# Patient Record
Sex: Male | Born: 1970 | Race: White | Hispanic: No | Marital: Married | Smoking: Never smoker
Health system: Southern US, Community
[De-identification: ages and names within clinical notes are randomized; demographics above are authoritative.]

## PROBLEM LIST (undated history)

## (undated) DIAGNOSIS — K429 Umbilical hernia without obstruction or gangrene: Secondary | ICD-10-CM

## (undated) DIAGNOSIS — Z8709 Personal history of other diseases of the respiratory system: Secondary | ICD-10-CM

## (undated) DIAGNOSIS — M1990 Unspecified osteoarthritis, unspecified site: Secondary | ICD-10-CM

## (undated) DIAGNOSIS — Z973 Presence of spectacles and contact lenses: Secondary | ICD-10-CM

## (undated) HISTORY — PX: KNEE ARTHROSCOPY: SUR90

---

## 2010-02-16 ENCOUNTER — Emergency Department (HOSPITAL_COMMUNITY)
Admission: EM | Admit: 2010-02-16 | Discharge: 2010-02-16 | Payer: Self-pay | Source: Home / Self Care | Admitting: Emergency Medicine

## 2015-10-30 DIAGNOSIS — F341 Dysthymic disorder: Secondary | ICD-10-CM | POA: Diagnosis not present

## 2016-01-02 DIAGNOSIS — R5381 Other malaise: Secondary | ICD-10-CM | POA: Diagnosis not present

## 2016-01-02 DIAGNOSIS — F418 Other specified anxiety disorders: Secondary | ICD-10-CM | POA: Diagnosis not present

## 2016-01-02 DIAGNOSIS — J45909 Unspecified asthma, uncomplicated: Secondary | ICD-10-CM | POA: Diagnosis not present

## 2016-01-02 DIAGNOSIS — R591 Generalized enlarged lymph nodes: Secondary | ICD-10-CM | POA: Diagnosis not present

## 2016-06-23 DIAGNOSIS — Z125 Encounter for screening for malignant neoplasm of prostate: Secondary | ICD-10-CM | POA: Diagnosis not present

## 2016-06-23 DIAGNOSIS — E559 Vitamin D deficiency, unspecified: Secondary | ICD-10-CM | POA: Diagnosis not present

## 2016-06-23 DIAGNOSIS — Z0000 Encounter for general adult medical examination without abnormal findings: Secondary | ICD-10-CM | POA: Diagnosis not present

## 2016-06-23 DIAGNOSIS — R5381 Other malaise: Secondary | ICD-10-CM | POA: Diagnosis not present

## 2016-06-30 DIAGNOSIS — Z125 Encounter for screening for malignant neoplasm of prostate: Secondary | ICD-10-CM | POA: Diagnosis not present

## 2016-06-30 DIAGNOSIS — M25551 Pain in right hip: Secondary | ICD-10-CM | POA: Diagnosis not present

## 2016-06-30 DIAGNOSIS — F4321 Adjustment disorder with depressed mood: Secondary | ICD-10-CM | POA: Diagnosis not present

## 2016-06-30 DIAGNOSIS — Z23 Encounter for immunization: Secondary | ICD-10-CM | POA: Diagnosis not present

## 2016-06-30 DIAGNOSIS — Z1389 Encounter for screening for other disorder: Secondary | ICD-10-CM | POA: Diagnosis not present

## 2016-06-30 DIAGNOSIS — Z0000 Encounter for general adult medical examination without abnormal findings: Secondary | ICD-10-CM | POA: Diagnosis not present

## 2016-06-30 DIAGNOSIS — E559 Vitamin D deficiency, unspecified: Secondary | ICD-10-CM | POA: Diagnosis not present

## 2016-06-30 DIAGNOSIS — F430 Acute stress reaction: Secondary | ICD-10-CM | POA: Diagnosis not present

## 2016-07-06 DIAGNOSIS — Z1212 Encounter for screening for malignant neoplasm of rectum: Secondary | ICD-10-CM | POA: Diagnosis not present

## 2016-07-17 ENCOUNTER — Ambulatory Visit: Payer: Self-pay | Admitting: General Surgery

## 2016-07-17 DIAGNOSIS — K429 Umbilical hernia without obstruction or gangrene: Secondary | ICD-10-CM | POA: Diagnosis not present

## 2016-07-28 ENCOUNTER — Ambulatory Visit (INDEPENDENT_AMBULATORY_CARE_PROVIDER_SITE_OTHER): Payer: BLUE CROSS/BLUE SHIELD | Admitting: Family Medicine

## 2016-07-28 ENCOUNTER — Ambulatory Visit (HOSPITAL_BASED_OUTPATIENT_CLINIC_OR_DEPARTMENT_OTHER)
Admission: RE | Admit: 2016-07-28 | Discharge: 2016-07-28 | Disposition: A | Discharge status: Home / Self Care | Payer: BLUE CROSS/BLUE SHIELD | Source: Ambulatory Visit | Attending: Family Medicine | Admitting: Family Medicine

## 2016-07-28 ENCOUNTER — Encounter: Payer: Self-pay | Admitting: Family Medicine

## 2016-07-28 VITALS — BP 125/69 | HR 76 | Ht 71.0 in | Wt 206.0 lb

## 2016-07-28 DIAGNOSIS — M25551 Pain in right hip: Secondary | ICD-10-CM | POA: Insufficient documentation

## 2016-07-28 DIAGNOSIS — M1611 Unilateral primary osteoarthritis, right hip: Secondary | ICD-10-CM | POA: Diagnosis not present

## 2016-07-28 DIAGNOSIS — M25751 Osteophyte, right hip: Secondary | ICD-10-CM | POA: Diagnosis not present

## 2016-07-30 DIAGNOSIS — M25551 Pain in right hip: Secondary | ICD-10-CM | POA: Insufficient documentation

## 2016-07-30 NOTE — Progress Notes (Signed)
PCP: Creola Cornusso, John, MD  Subjective:   HPI: Patient is a 46 y.o. male here for right hip pain.  Patient reports he's had problems with right hip for years. However past 6 months the pain is worse. Pain is 1/10 at rest, up to 6/10 and sharp at worst. Used to play tennis but hasn't in a couple years. Pain stays anterior hip. No radiation. No numbness or tingling. Feels motion is limited. Worse with flexion of hip. No back pain.  No past medical history on file.  No current outpatient prescriptions on file prior to visit.   No current facility-administered medications on file prior to visit.     No past surgical history on file.  No Known Allergies  Social History   Social History  . Marital status: Married    Spouse name: N/A  . Number of children: N/A  . Years of education: N/A   Occupational History  . Not on file.   Social History Main Topics  . Smoking status: Never Smoker  . Smokeless tobacco: Never Used  . Alcohol use Not on file  . Drug use: Unknown  . Sexual activity: Not on file   Other Topics Concern  . Not on file   Social History Narrative  . No narrative on file    No family history on file.  BP 125/69   Pulse 76   Ht 5\' 11"  (1.803 m)   Wt 206 lb (93.4 kg)   BMI 28.73 kg/m   Review of Systems: See HPI above.     Objective:  Physical Exam:  Gen: NAD, comfortable in exam room  Back/right hip: No gross deformity, scoliosis. TTP over anterior hip joint.  No midline or bony TTP.  No back tenderness. FROM back but only has 5 degrees IR and ER of hip. Strength LEs 5/5 all muscle groups - some pain with hip flexion. 2+ MSRs in patellar and achilles tendons, equal bilaterally. Negative SLRs. Sensation intact to light touch bilaterally. Positive logroll right hip, negative left   Assessment & Plan:  1. Right hip pain - independently reviewed radiographs showing moderate degenerative changes of right hip - also has morphology of cam  impingement.  We discussed tylenol, topical medications, some supplements that may help.  Encouraged physical therapy which he will start.  Will also set him up to have an intraarticular cortisone injection of this hip due to level of his pain.  Heat or ice if needed.  Plan to follow up in 1 month to 6 weeks for reevaluation.

## 2016-07-30 NOTE — Assessment & Plan Note (Signed)
independently reviewed radiographs showing moderate degenerative changes of right hip - also has morphology of cam impingement.  We discussed tylenol, topical medications, some supplements that may help.  Encouraged physical therapy which he will start.  Will also set him up to have an intraarticular cortisone injection of this hip due to level of his pain.  Heat or ice if needed.  Plan to follow up in 1 month to 6 weeks for reevaluation.

## 2016-07-31 ENCOUNTER — Other Ambulatory Visit: Payer: Self-pay | Admitting: Family Medicine

## 2016-07-31 DIAGNOSIS — M25551 Pain in right hip: Secondary | ICD-10-CM

## 2016-08-06 ENCOUNTER — Ambulatory Visit
Admission: RE | Admit: 2016-08-06 | Discharge: 2016-08-06 | Disposition: A | Discharge status: Home / Self Care | Payer: BLUE CROSS/BLUE SHIELD | Source: Ambulatory Visit | Attending: Family Medicine | Admitting: Family Medicine

## 2016-08-06 DIAGNOSIS — M25551 Pain in right hip: Secondary | ICD-10-CM

## 2016-08-06 DIAGNOSIS — M1611 Unilateral primary osteoarthritis, right hip: Secondary | ICD-10-CM | POA: Diagnosis not present

## 2016-08-06 MED ORDER — IOPAMIDOL (ISOVUE-M 200) INJECTION 41%
1.0000 mL | Freq: Once | INTRAMUSCULAR | Status: AC
  Administered 2016-08-06: 1 mL via INTRA_ARTICULAR

## 2016-08-06 MED ORDER — METHYLPREDNISOLONE ACETATE 40 MG/ML INJ SUSP (RADIOLOG
120.0000 mg | Freq: Once | INTRAMUSCULAR | Status: AC
  Administered 2016-08-06: 120 mg via INTRA_ARTICULAR

## 2016-10-15 DIAGNOSIS — M25551 Pain in right hip: Secondary | ICD-10-CM | POA: Diagnosis not present

## 2016-10-21 DIAGNOSIS — M25551 Pain in right hip: Secondary | ICD-10-CM | POA: Diagnosis not present

## 2016-10-29 DIAGNOSIS — M25551 Pain in right hip: Secondary | ICD-10-CM | POA: Diagnosis not present

## 2016-11-05 DIAGNOSIS — M25551 Pain in right hip: Secondary | ICD-10-CM | POA: Diagnosis not present

## 2016-11-18 DIAGNOSIS — M25551 Pain in right hip: Secondary | ICD-10-CM | POA: Diagnosis not present

## 2016-12-02 DIAGNOSIS — M25551 Pain in right hip: Secondary | ICD-10-CM | POA: Diagnosis not present

## 2016-12-10 ENCOUNTER — Encounter: Payer: Self-pay | Admitting: Family Medicine

## 2016-12-17 DIAGNOSIS — M25551 Pain in right hip: Secondary | ICD-10-CM | POA: Diagnosis not present

## 2016-12-31 DIAGNOSIS — M25551 Pain in right hip: Secondary | ICD-10-CM | POA: Diagnosis not present

## 2017-01-14 DIAGNOSIS — M25551 Pain in right hip: Secondary | ICD-10-CM | POA: Diagnosis not present

## 2017-02-02 DIAGNOSIS — M25551 Pain in right hip: Secondary | ICD-10-CM | POA: Diagnosis not present

## 2017-02-19 DIAGNOSIS — M25551 Pain in right hip: Secondary | ICD-10-CM | POA: Diagnosis not present

## 2017-04-22 DIAGNOSIS — M25551 Pain in right hip: Secondary | ICD-10-CM | POA: Diagnosis not present

## 2017-05-13 DIAGNOSIS — M25551 Pain in right hip: Secondary | ICD-10-CM | POA: Diagnosis not present

## 2017-06-01 DIAGNOSIS — M25551 Pain in right hip: Secondary | ICD-10-CM | POA: Diagnosis not present

## 2017-07-06 DIAGNOSIS — E559 Vitamin D deficiency, unspecified: Secondary | ICD-10-CM | POA: Diagnosis not present

## 2017-07-06 DIAGNOSIS — Z125 Encounter for screening for malignant neoplasm of prostate: Secondary | ICD-10-CM | POA: Diagnosis not present

## 2017-07-06 DIAGNOSIS — Z0000 Encounter for general adult medical examination without abnormal findings: Secondary | ICD-10-CM | POA: Diagnosis not present

## 2017-07-06 DIAGNOSIS — R5381 Other malaise: Secondary | ICD-10-CM | POA: Diagnosis not present

## 2017-07-13 DIAGNOSIS — K429 Umbilical hernia without obstruction or gangrene: Secondary | ICD-10-CM | POA: Diagnosis not present

## 2017-07-13 DIAGNOSIS — F4321 Adjustment disorder with depressed mood: Secondary | ICD-10-CM | POA: Diagnosis not present

## 2017-07-13 DIAGNOSIS — Z0000 Encounter for general adult medical examination without abnormal findings: Secondary | ICD-10-CM | POA: Diagnosis not present

## 2017-07-13 DIAGNOSIS — E786 Lipoprotein deficiency: Secondary | ICD-10-CM | POA: Diagnosis not present

## 2017-07-13 DIAGNOSIS — Z1389 Encounter for screening for other disorder: Secondary | ICD-10-CM | POA: Diagnosis not present

## 2017-07-13 DIAGNOSIS — M25551 Pain in right hip: Secondary | ICD-10-CM | POA: Diagnosis not present

## 2017-07-14 ENCOUNTER — Other Ambulatory Visit: Payer: Self-pay | Admitting: Internal Medicine

## 2017-07-14 DIAGNOSIS — E786 Lipoprotein deficiency: Secondary | ICD-10-CM

## 2017-08-10 ENCOUNTER — Other Ambulatory Visit: Payer: BLUE CROSS/BLUE SHIELD

## 2017-08-10 ENCOUNTER — Ambulatory Visit
Admission: RE | Admit: 2017-08-10 | Discharge: 2017-08-10 | Disposition: A | Discharge status: Home / Self Care | Payer: No Typology Code available for payment source | Source: Ambulatory Visit | Attending: Internal Medicine | Admitting: Internal Medicine

## 2017-08-10 DIAGNOSIS — E786 Lipoprotein deficiency: Secondary | ICD-10-CM

## 2017-08-19 DIAGNOSIS — M1611 Unilateral primary osteoarthritis, right hip: Secondary | ICD-10-CM | POA: Diagnosis not present

## 2017-09-14 DIAGNOSIS — K429 Umbilical hernia without obstruction or gangrene: Secondary | ICD-10-CM | POA: Diagnosis not present

## 2017-09-24 ENCOUNTER — Other Ambulatory Visit: Payer: Self-pay

## 2017-09-24 ENCOUNTER — Encounter (HOSPITAL_BASED_OUTPATIENT_CLINIC_OR_DEPARTMENT_OTHER): Payer: Self-pay | Admitting: *Deleted

## 2017-09-24 DIAGNOSIS — M25551 Pain in right hip: Secondary | ICD-10-CM | POA: Diagnosis not present

## 2017-09-24 NOTE — Progress Notes (Signed)
Spoke w/ pt via phone for pre-op interview.  Pt verbalized understanding to be npo after mn w/ exception clear liquids until 0845 then nothing by mouth and arrive at 0945 to [facility].  Pre-op orders pending.  Or scheduler, brenda garrett has been called twice before today.  Called via phone and spoke w/ brenda garrett, or scheduler, today stated she will track him down to put orders in epic.

## 2017-09-27 ENCOUNTER — Ambulatory Visit: Payer: Self-pay | Admitting: General Surgery

## 2017-09-27 NOTE — Progress Notes (Signed)
Steward DroneBrenda states Dr. Sheliah HatchKinsinger will be in office this afternoon and will place orders at that time.

## 2017-09-28 ENCOUNTER — Ambulatory Visit (HOSPITAL_COMMUNITY): Payer: BLUE CROSS/BLUE SHIELD

## 2017-09-28 ENCOUNTER — Encounter (HOSPITAL_BASED_OUTPATIENT_CLINIC_OR_DEPARTMENT_OTHER): Payer: Self-pay | Admitting: *Deleted

## 2017-09-28 ENCOUNTER — Ambulatory Visit (HOSPITAL_BASED_OUTPATIENT_CLINIC_OR_DEPARTMENT_OTHER): Payer: BLUE CROSS/BLUE SHIELD | Admitting: Anesthesiology

## 2017-09-28 ENCOUNTER — Other Ambulatory Visit: Payer: Self-pay

## 2017-09-28 ENCOUNTER — Encounter (HOSPITAL_BASED_OUTPATIENT_CLINIC_OR_DEPARTMENT_OTHER)
Admission: RE | Disposition: A | Discharge status: Home / Self Care | Payer: Self-pay | Source: Ambulatory Visit | Attending: General Surgery

## 2017-09-28 ENCOUNTER — Ambulatory Visit (HOSPITAL_BASED_OUTPATIENT_CLINIC_OR_DEPARTMENT_OTHER)
Admission: RE | Admit: 2017-09-28 | Discharge: 2017-09-28 | Disposition: A | Discharge status: Home / Self Care | Payer: BLUE CROSS/BLUE SHIELD | Source: Ambulatory Visit | Attending: General Surgery | Admitting: General Surgery

## 2017-09-28 DIAGNOSIS — T17908A Unspecified foreign body in respiratory tract, part unspecified causing other injury, initial encounter: Secondary | ICD-10-CM

## 2017-09-28 DIAGNOSIS — Z79899 Other long term (current) drug therapy: Secondary | ICD-10-CM | POA: Insufficient documentation

## 2017-09-28 DIAGNOSIS — K420 Umbilical hernia with obstruction, without gangrene: Secondary | ICD-10-CM | POA: Insufficient documentation

## 2017-09-28 DIAGNOSIS — K429 Umbilical hernia without obstruction or gangrene: Secondary | ICD-10-CM | POA: Diagnosis not present

## 2017-09-28 DIAGNOSIS — R1110 Vomiting, unspecified: Secondary | ICD-10-CM | POA: Diagnosis not present

## 2017-09-28 DIAGNOSIS — R109 Unspecified abdominal pain: Secondary | ICD-10-CM | POA: Diagnosis not present

## 2017-09-28 HISTORY — DX: Umbilical hernia without obstruction or gangrene: K42.9

## 2017-09-28 HISTORY — DX: Presence of spectacles and contact lenses: Z97.3

## 2017-09-28 HISTORY — DX: Unspecified osteoarthritis, unspecified site: M19.90

## 2017-09-28 HISTORY — DX: Personal history of other diseases of the respiratory system: Z87.09

## 2017-09-28 HISTORY — PX: UMBILICAL HERNIA REPAIR: SHX196

## 2017-09-28 SURGERY — REPAIR, HERNIA, UMBILICAL, ADULT
Anesthesia: General

## 2017-09-28 MED ORDER — SUGAMMADEX SODIUM 500 MG/5ML IV SOLN
INTRAVENOUS | Status: DC | PRN
  Administered 2017-09-28: 200 mg via INTRAVENOUS

## 2017-09-28 MED ORDER — CEFAZOLIN SODIUM-DEXTROSE 2-4 GM/100ML-% IV SOLN
INTRAVENOUS | Status: AC
  Filled 2017-09-28: qty 100

## 2017-09-28 MED ORDER — DEXAMETHASONE SODIUM PHOSPHATE 10 MG/ML IJ SOLN
INTRAMUSCULAR | Status: AC
  Filled 2017-09-28: qty 1

## 2017-09-28 MED ORDER — MEPERIDINE HCL 25 MG/ML IJ SOLN
6.2500 mg | INTRAMUSCULAR | Status: DC | PRN
  Filled 2017-09-28: qty 1

## 2017-09-28 MED ORDER — MIDAZOLAM HCL 5 MG/5ML IJ SOLN
INTRAMUSCULAR | Status: DC | PRN
  Administered 2017-09-28: 2 mg via INTRAVENOUS

## 2017-09-28 MED ORDER — KETOROLAC TROMETHAMINE 30 MG/ML IJ SOLN
INTRAMUSCULAR | Status: DC | PRN
  Administered 2017-09-28: 30 mg via INTRAVENOUS

## 2017-09-28 MED ORDER — ACETAMINOPHEN 500 MG PO TABS
ORAL_TABLET | ORAL | Status: AC
  Filled 2017-09-28: qty 2

## 2017-09-28 MED ORDER — KETOROLAC TROMETHAMINE 30 MG/ML IJ SOLN
INTRAMUSCULAR | Status: AC
  Filled 2017-09-28: qty 1

## 2017-09-28 MED ORDER — ONDANSETRON HCL 4 MG/2ML IJ SOLN
4.0000 mg | Freq: Once | INTRAMUSCULAR | Status: DC | PRN
  Filled 2017-09-28: qty 2

## 2017-09-28 MED ORDER — SUGAMMADEX SODIUM 200 MG/2ML IV SOLN
INTRAVENOUS | Status: AC
  Filled 2017-09-28: qty 2

## 2017-09-28 MED ORDER — SUCCINYLCHOLINE CHLORIDE 20 MG/ML IJ SOLN
INTRAMUSCULAR | Status: DC | PRN
  Administered 2017-09-28: 120 mg via INTRAVENOUS

## 2017-09-28 MED ORDER — GABAPENTIN 300 MG PO CAPS
300.0000 mg | ORAL_CAPSULE | ORAL | Status: AC
  Administered 2017-09-28: 300 mg via ORAL
  Filled 2017-09-28: qty 1

## 2017-09-28 MED ORDER — PROPOFOL 10 MG/ML IV BOLUS
INTRAVENOUS | Status: DC | PRN
  Administered 2017-09-28: 150 mg via INTRAVENOUS

## 2017-09-28 MED ORDER — HYDROCODONE-ACETAMINOPHEN 5-325 MG PO TABS: 1.0000 | ORAL_TABLET | Freq: Four times a day (QID) | ORAL | 0 refills | Status: AC | PRN

## 2017-09-28 MED ORDER — DEXAMETHASONE SODIUM PHOSPHATE 4 MG/ML IJ SOLN
INTRAMUSCULAR | Status: DC | PRN
  Administered 2017-09-28: 10 mg via INTRAVENOUS

## 2017-09-28 MED ORDER — FENTANYL CITRATE (PF) 100 MCG/2ML IJ SOLN
INTRAMUSCULAR | Status: DC | PRN
  Administered 2017-09-28: 100 ug via INTRAVENOUS

## 2017-09-28 MED ORDER — ROCURONIUM BROMIDE 100 MG/10ML IV SOLN
INTRAVENOUS | Status: DC | PRN
  Administered 2017-09-28: 50 mg via INTRAVENOUS

## 2017-09-28 MED ORDER — BUPIVACAINE LIPOSOME 1.3 % IJ SUSP
INTRAMUSCULAR | Status: DC | PRN
  Administered 2017-09-28: 50 mL

## 2017-09-28 MED ORDER — MIDAZOLAM HCL 2 MG/2ML IJ SOLN
INTRAMUSCULAR | Status: AC
  Filled 2017-09-28: qty 2

## 2017-09-28 MED ORDER — CEFAZOLIN SODIUM-DEXTROSE 2-4 GM/100ML-% IV SOLN
2.0000 g | INTRAVENOUS | Status: AC
  Administered 2017-09-28: 2 g via INTRAVENOUS
  Filled 2017-09-28: qty 100

## 2017-09-28 MED ORDER — IBUPROFEN 800 MG PO TABS: 800.0000 mg | ORAL_TABLET | Freq: Three times a day (TID) | ORAL | 0 refills | Status: AC | PRN

## 2017-09-28 MED ORDER — ONDANSETRON HCL 4 MG/2ML IJ SOLN
INTRAMUSCULAR | Status: AC
  Filled 2017-09-28: qty 2

## 2017-09-28 MED ORDER — LIDOCAINE HCL (CARDIAC) PF 100 MG/5ML IV SOSY
PREFILLED_SYRINGE | INTRAVENOUS | Status: DC | PRN
  Administered 2017-09-28: 100 mg via INTRAVENOUS

## 2017-09-28 MED ORDER — ACETAMINOPHEN 500 MG PO TABS
1000.0000 mg | ORAL_TABLET | ORAL | Status: AC
  Administered 2017-09-28: 1000 mg via ORAL
  Filled 2017-09-28: qty 2

## 2017-09-28 MED ORDER — LACTATED RINGERS IV SOLN
INTRAVENOUS | Status: DC
  Administered 2017-09-28 (×3): via INTRAVENOUS
  Filled 2017-09-28: qty 1000

## 2017-09-28 MED ORDER — ROCURONIUM BROMIDE 100 MG/10ML IV SOLN
INTRAVENOUS | Status: AC
  Filled 2017-09-28: qty 1

## 2017-09-28 MED ORDER — PROPOFOL 10 MG/ML IV BOLUS
INTRAVENOUS | Status: AC
  Filled 2017-09-28: qty 20

## 2017-09-28 MED ORDER — CHLORHEXIDINE GLUCONATE 4 % EX LIQD
60.0000 mL | Freq: Once | CUTANEOUS | Status: DC
  Filled 2017-09-28: qty 118

## 2017-09-28 MED ORDER — ENSURE PRE-SURGERY PO LIQD
296.0000 mL | Freq: Once | ORAL | Status: DC
  Filled 2017-09-28: qty 296

## 2017-09-28 MED ORDER — FENTANYL CITRATE (PF) 100 MCG/2ML IJ SOLN
INTRAMUSCULAR | Status: AC
  Filled 2017-09-28: qty 2

## 2017-09-28 MED ORDER — SUCCINYLCHOLINE CHLORIDE 200 MG/10ML IV SOSY
PREFILLED_SYRINGE | INTRAVENOUS | Status: AC
  Filled 2017-09-28: qty 10

## 2017-09-28 MED ORDER — FUROSEMIDE 10 MG/ML IJ SOLN
INTRAMUSCULAR | Status: DC | PRN
  Administered 2017-09-28: 20 mg via INTRAMUSCULAR

## 2017-09-28 MED ORDER — HYDROMORPHONE HCL 1 MG/ML IJ SOLN
0.2500 mg | INTRAMUSCULAR | Status: DC | PRN
Start: 2017-09-28 — End: 2017-09-28
  Filled 2017-09-28: qty 0.5

## 2017-09-28 MED ORDER — GABAPENTIN 300 MG PO CAPS
ORAL_CAPSULE | ORAL | Status: AC
  Filled 2017-09-28: qty 1

## 2017-09-28 MED ORDER — FUROSEMIDE 10 MG/ML IJ SOLN
INTRAMUSCULAR | Status: AC
  Filled 2017-09-28: qty 2

## 2017-09-28 MED ORDER — ONDANSETRON HCL 4 MG/2ML IJ SOLN
INTRAMUSCULAR | Status: DC | PRN
  Administered 2017-09-28: 4 mg via INTRAVENOUS

## 2017-09-28 SURGICAL SUPPLY — 43 items
BLADE HEX COATED 2.75 (ELECTRODE) ×2 IMPLANT
BLADE SURG 15 STRL LF DISP TIS (BLADE) ×1 IMPLANT
BLADE SURG 15 STRL SS (BLADE) ×1
BLADE SURG ROTATE 9660 (MISCELLANEOUS) IMPLANT
CANISTER SUCT 3000ML PPV (MISCELLANEOUS) IMPLANT
CELLS DAT CNTRL 66122 CELL SVR (MISCELLANEOUS) IMPLANT
CHLORAPREP W/TINT 26ML (MISCELLANEOUS) ×2 IMPLANT
COVER BACK TABLE 60X90IN (DRAPES) ×2 IMPLANT
COVER MAYO STAND STRL (DRAPES) ×2 IMPLANT
DECANTER SPIKE VIAL GLASS SM (MISCELLANEOUS) IMPLANT
DERMABOND ADVANCED (GAUZE/BANDAGES/DRESSINGS) ×1
DERMABOND ADVANCED .7 DNX12 (GAUZE/BANDAGES/DRESSINGS) ×1 IMPLANT
DRAPE LAPAROSCOPIC ABDOMINAL (DRAPES) ×2 IMPLANT
DRAPE UTILITY XL STRL (DRAPES) ×2 IMPLANT
ELECT REM PT RETURN 9FT ADLT (ELECTROSURGICAL) ×2
ELECTRODE REM PT RTRN 9FT ADLT (ELECTROSURGICAL) ×1 IMPLANT
GLOVE BIOGEL PI IND STRL 7.0 (GLOVE) ×1 IMPLANT
GLOVE BIOGEL PI INDICATOR 7.0 (GLOVE) ×1
GLOVE SURG SS PI 7.0 STRL IVOR (GLOVE) ×2 IMPLANT
GOWN STRL REUS W/TWL LRG LVL3 (GOWN DISPOSABLE) ×4 IMPLANT
KIT TURNOVER CYSTO (KITS) ×2 IMPLANT
NEEDLE HYPO 25X1 1.5 SAFETY (NEEDLE) ×2 IMPLANT
NS IRRIG 500ML POUR BTL (IV SOLUTION) ×2 IMPLANT
PACK BASIN DAY SURGERY FS (CUSTOM PROCEDURE TRAY) ×2 IMPLANT
PENCIL BUTTON HOLSTER BLD 10FT (ELECTRODE) ×2 IMPLANT
RTRCTR WOUND ALEXIS 18CM MED (MISCELLANEOUS)
RTRCTR WOUND ALEXIS 18CM SML (INSTRUMENTS)
SAVER CELL AAL HAEMONETICS (INSTRUMENTS) IMPLANT
SPONGE LAP 4X18 RFD (DISPOSABLE) ×2 IMPLANT
SUT MNCRL AB 4-0 PS2 18 (SUTURE) ×2 IMPLANT
SUT NOVA NAB GS-21 0 18 T12 DT (SUTURE) ×2 IMPLANT
SUT PDS AB 0 CT1 36 (SUTURE) ×2 IMPLANT
SUT PROLENE 0 CT 1 CR/8 (SUTURE) IMPLANT
SUT VIC AB 0 SH 27 (SUTURE) IMPLANT
SUT VIC AB 2-0 SH 27 (SUTURE)
SUT VIC AB 2-0 SH 27XBRD (SUTURE) IMPLANT
SUT VIC AB 3-0 SH 27 (SUTURE) ×1
SUT VIC AB 3-0 SH 27X BRD (SUTURE) ×1 IMPLANT
SYR BULB IRRIGATION 50ML (SYRINGE) ×2 IMPLANT
SYR CONTROL 10ML LL (SYRINGE) ×2 IMPLANT
TOWEL OR 17X24 6PK STRL BLUE (TOWEL DISPOSABLE) ×4 IMPLANT
TUBE CONNECTING 12X1/4 (SUCTIONS) ×2 IMPLANT
YANKAUER SUCT BULB TIP NO VENT (SUCTIONS) ×2 IMPLANT

## 2017-09-28 NOTE — Progress Notes (Signed)
Dr Krista BlueSinger MDA here to evaluate-O2 maintaining 95% or above. Verbalized to discharge home. Bilateral  breath sounds clear.

## 2017-09-28 NOTE — Anesthesia Procedure Notes (Addendum)
Procedure Name: Intubation Date/Time: 09/28/2017 11:09 AM Performed by: Wanita Chamberlain, CRNA Pre-anesthesia Checklist: Patient identified, Emergency Drugs available, Suction available, Patient being monitored and Timeout performed Patient Re-evaluated:Patient Re-evaluated prior to induction Oxygen Delivery Method: Circle system utilized Preoxygenation: Pre-oxygenation with 100% oxygen Induction Type: IV induction and Cricoid Pressure applied Ventilation: Mask ventilation without difficulty Laryngoscope Size: Mac and 4 Grade View: Grade I Tube type: Oral Tube size: 7.5 mm Number of attempts: 1 Placement Confirmation: breath sounds checked- equal and bilateral,  CO2 detector,  positive ETCO2 and ETT inserted through vocal cords under direct vision Secured at: 23 cm Tube secured with: Tape Dental Injury: Teeth and Oropharynx as per pre-operative assessment

## 2017-09-28 NOTE — Transfer of Care (Signed)
Immediate Anesthesia Transfer of Care Note  Patient: Donald ArenaDaniel W Lucus  Procedure(s) Performed: Procedure(s) (LRB): open HERNIA REPAIR UMBILICAL ADULT (N/A)  Patient Location: PACU  Anesthesia Type: General  Level of Consciousness: awake, sedated, patient cooperative and responds to stimulation  Airway & Oxygen Therapy: Patient Spontanous Breathing and Patient connected to face mask oxygen  Post-op Assessment: Report given to PACU RN, Post -op Vital signs reviewed and stable and Patient moving all extremities  Post vital signs: Reviewed and stable  Complications: No apparent anesthesia complications

## 2017-09-28 NOTE — H&P (Signed)
Donald Morgan is an 47 y.o. male.   Chief Complaint: abdominal pain HPI: 47 yo male with abdominal pain especially with lifting found to have a umbilical hernia.  Past Medical History:  Diagnosis Date  . Arthritis    right hip  . History of asthma    childhood  . Umbilical hernia   . Wears glasses     Past Surgical History:  Procedure Laterality Date  . KNEE ARTHROSCOPY Left age 47    History reviewed. No pertinent family history. Social History:  reports that he has never smoked. He has never used smokeless tobacco. He reports that he drinks about 10.8 oz of alcohol per week. His drug history is not on file.  Allergies: No Known Allergies  Medications Prior to Admission  Medication Sig Dispense Refill  . acetaminophen (TYLENOL) 500 MG tablet Take 500 mg by mouth every 6 (six) hours as needed.    Marland Kitchen. escitalopram (LEXAPRO) 20 MG tablet TK 1 T PO D-- takes in pm  6  . ibuprofen (ADVIL,MOTRIN) 200 MG tablet Take 200 mg by mouth every 6 (six) hours as needed.    . IRON PO Take by mouth daily.     . Multiple Vitamin (MULTIVITAMIN) tablet Take 1 tablet by mouth daily.    . naproxen sodium (ALEVE) 220 MG tablet Take 220 mg by mouth 2 (two) times daily as needed.      No results found for this or any previous visit (from the past 48 hour(s)). No results found.  Review of Systems  Constitutional: Negative for chills and fever.  HENT: Negative for hearing loss.   Eyes: Negative for blurred vision and double vision.  Respiratory: Negative for cough and hemoptysis.   Cardiovascular: Negative for chest pain and palpitations.  Gastrointestinal: Positive for abdominal pain. Negative for nausea and vomiting.  Genitourinary: Negative for dysuria and urgency.  Musculoskeletal: Negative for myalgias and neck pain.  Skin: Negative for itching and rash.  Neurological: Negative for dizziness, tingling and headaches.  Endo/Heme/Allergies: Does not bruise/bleed easily.   Psychiatric/Behavioral: Negative for depression and suicidal ideas.    Blood pressure 129/89, pulse (!) 58, temperature 99 F (37.2 C), temperature source Oral, resp. rate 18, height 5\' 11"  (1.803 m), weight 94.3 kg (207 lb 12.8 oz), SpO2 99 %. Physical Exam  Vitals reviewed. Constitutional: He is oriented to person, place, and time. He appears well-developed and well-nourished.  HENT:  Head: Normocephalic and atraumatic.  Eyes: Pupils are equal, round, and reactive to light. Conjunctivae and EOM are normal.  Neck: Normal range of motion. Neck supple.  Cardiovascular: Normal rate and regular rhythm.  Respiratory: Effort normal and breath sounds normal.  GI: Soft. Bowel sounds are normal. He exhibits no distension. There is no tenderness.  Umbilical hernia  Musculoskeletal: Normal range of motion.  Neurological: He is alert and oriented to person, place, and time.  Skin: Skin is warm and dry.  Psychiatric: He has a normal mood and affect. His behavior is normal.     Assessment/Plan 47 yo male with umbilical hernia -open umbilical hernia repair with mesh -outpatient procedure -enhanced recovery  Rodman PickleLuke Aaron Yoandri Congrove, MD 09/28/2017, 10:46 AM

## 2017-09-28 NOTE — Discharge Instructions (Signed)
Information for Discharge Teaching: EXPAREL (bupivacaine liposome injectable suspension)   Your surgeon gave you EXPAREL(bupivacaine) in your surgical incision to help control your pain after surgery.   EXPAREL is a local anesthetic that provides pain relief by numbing the tissue around the surgical site.  EXPAREL is designed to release pain medication over time and can control pain for up to 72 hours.  Depending on how you respond to EXPAREL, you may require less pain medication during your recovery.  Possible side effects:  Temporary loss of sensation or ability to move in the area where bupivacaine was injected.  Nausea, vomiting, constipation  Rarely, numbness and tingling in your mouth or lips, lightheadedness, or anxiety may occur.  Call your doctor right away if you think you may be experiencing any of these sensations, or if you have other questions regarding possible side effects.  Follow all other discharge instructions given to you by your surgeon or nurse. Eat a healthy diet and drink plenty of water or other fluids.  If you return to the hospital for any reason within 96 hours following the administration of EXPAREL, please inform your health care providers. Post Anesthesia Home Care Instructions  Activity: Get plenty of rest for the remainder of the day. A responsible individual must stay with you for 24 hours following the procedure.  For the next 24 hours, DO NOT: -Drive a car -Operate machinery -Drink alcoholic beverages -Take any medication unless instructed by your physician -Make any legal decisions or sign important papers.  Meals: Start with liquid foods such as gelatin or soup. Progress to regular foods as tolerated. Avoid greasy, spicy, heavy foods. If nausea and/or vomiting occur, drink only clear liquids until the nausea and/or vomiting subsides. Call your physician if vomiting continues.  Special Instructions/Symptoms: Your throat may feel dry or sore  from the anesthesia or the breathing tube placed in your throat during surgery. If this causes discomfort, gargle with warm salt water. The discomfort should disappear within 24 hours.  If you had a scopolamine patch placed behind your ear for the management of post- operative nausea and/or vomiting:  1. The medication in the patch is effective for 72 hours, after which it should be removed.  Wrap patch in a tissue and discard in the trash. Wash hands thoroughly with soap and water. 2. You may remove the patch earlier than 72 hours if you experience unpleasant side effects which may include dry mouth, dizziness or visual disturbances. 3. Avoid touching the patch. Wash your hands with soap and water after contact with the patch.     

## 2017-09-28 NOTE — Progress Notes (Signed)
Pt developed negative pressure pulmonary edema while intubated due to biting down and occluding the ETT during emergence. Anectine was given. Frothy bloody fluid was suctioned from the ETT. Lasix 20mg  was given with improvement in saturations. Pt was suctioned multiple times and extubated and taken to the recovery room in stable condition. CXR was negative. He initially had congestion in his lungs which resolved over the first half hour in PACU.  We decided to observe him at least till the end of the day and Dr Krista BlueSinger will evaluate him for discharge at that time. Discussed with his wife and patient and answered all questions. Discussed with Dr Sheliah HatchKinsinger.  Arta BruceKevin Derhonda Eastlick MD

## 2017-09-28 NOTE — Anesthesia Postprocedure Evaluation (Signed)
Anesthesia Post Note  Patient: Lenise ArenaDaniel W Madero  Procedure(s) Performed: open HERNIA REPAIR UMBILICAL ADULT (N/A )     Patient location during evaluation: PACU Anesthesia Type: General Level of consciousness: awake and alert Pain management: pain level controlled Vital Signs Assessment: post-procedure vital signs reviewed and stable Respiratory status: spontaneous breathing, nonlabored ventilation, respiratory function stable and patient connected to nasal cannula oxygen Cardiovascular status: blood pressure returned to baseline and stable Postop Assessment: no apparent nausea or vomiting Anesthetic complications: yes Anesthetic complication details: anesthesia complicationsComments: Pt developed negative pressure pulmonary edema while intubated due to biting down and occluding the ETT during emergence. Anectine was given. Frothy bloody fluid was suctioned from the ETT. Lasix 20mg  was given with improvement in saturations. Pt was suctioned multiple times and extubated and taken to the recovery room in stable condition. CXR was negative. He initially had congestion in his lungs which resolved over the first half hour in PACU.  We decided to observe him at least till the end of the day and Dr Krista BlueSinger will evaluate him for discharge at that time. Discussed with his wife and patient and answered all questions. Arta BruceKevin Anshu Wehner MD    Last Vitals:  Vitals:   09/28/17 1330 09/28/17 1345  BP: 116/71 111/75  Pulse: 66 68  Resp: (!) 24 18  Temp:    SpO2: 100% 100%    Last Pain:  Vitals:   09/28/17 1330  TempSrc:   PainSc: 0-No pain                 Martavion Couper DAVID

## 2017-09-28 NOTE — Op Note (Signed)
PATIENT:  Lenise Arenaaniel W Mccaskill  47 y.o. male  PRE-OPERATIVE DIAGNOSIS:  umbilical hernia  POST-OPERATIVE DIAGNOSIS:  umbilical hernia  PROCEDURE:  Procedure(s): open HERNIA REPAIR UMBILICAL ADULT   SURGEON:  Surgeon(s): Aashika Carta, De BlanchLuke Aaron, MD  ASSISTANT: none   ANESTHESIA:   local and general  Indications for procedure: Lenise ArenaDaniel W Kirkes is a 47 y.o. year old male with symptoms of abdominal pain and finding of moderate sized umbilical hernia.  Description of procedure: The patient was brought into the operative suite. Anesthesia was administered with General endotracheal anesthesia. WHO checklist was applied. The patient was then placed in supine. The area was prepped and draped in the usual sterile fashion.  Next the infraumbilical skin was anesthetized with Exparel:Marcaine. A semilunar infraumbilical incision was made. Cautery and blunt dissection was used to dissect down to the fascia. The hernia sac was dissected free from surrounding tissues in 360 degrees. The umbilical skin was dissected free of the hernia sac with cautery. The sac was filled with omentum which was ligated at the base. The contents of the hernia sac were reduced. The hernia defect was 1 cm in diameter. The hernia sac was removed. Due to the size of the hernia, no mesh was utilized. The fascial defect was then primarily closed with interrupted 0 PDS sutures. The umbilical skin was sutured to the fascia with a 3-0 vicryl. The deep dermal space was closed with a 3-0 vicryl. 40 ml Exparel:Marcaine was injected into the muscle layer and around the fascia. The skin was closed with a 4-0 monocryl subcuticular suture. Dermabond was put in place for dressing. The patient awoke from anesthesia and was brought to pacu in stable condition. All counts were correct.  Findings: 1cm umbilical hernia with incarcerated omentum in sac  Specimen: none  Blood loss: 20 ml  Local anesthesia: 50 ml Exparel: Marcaine mix  Complications:  none  PLAN OF CARE: Discharge to home after PACU  PATIENT DISPOSITION:  PACU - hemodynamically stable.  Feliciana RossettiLuke Margery Szostak, M.D. General, Bariatric, & Minimally Invasive Surgery [facility], [place]  09/28/2017 11:49 AM

## 2017-09-28 NOTE — Progress Notes (Signed)
Maintaining Oxygen saturation greater than 94% on 2 liters. Placed on room air -transferred to phase 2-will continue to monitor oxygen levels.Comfortable .Wife at bedside.

## 2017-09-28 NOTE — Addendum Note (Signed)
Addendum  created 09/28/17 1454 by Jessica PriestBeeson, Ladell Lea C, CRNA   Intraprocedure Event edited

## 2017-09-28 NOTE — Anesthesia Preprocedure Evaluation (Signed)
Anesthesia Evaluation  Patient identified by MRN, date of birth, ID band Patient awake    Reviewed: Allergy & Precautions, NPO status , Patient's Chart, lab work & pertinent test results  Airway Mallampati: I  TM Distance: >3 FB Neck ROM: Full    Dental   Pulmonary    Pulmonary exam normal        Cardiovascular Normal cardiovascular exam     Neuro/Psych    GI/Hepatic   Endo/Other    Renal/GU      Musculoskeletal   Abdominal   Peds  Hematology   Anesthesia Other Findings   Reproductive/Obstetrics                             Anesthesia Physical Anesthesia Plan  ASA: II  Anesthesia Plan: General   Post-op Pain Management:    Induction: Intravenous  PONV Risk Score and Plan: 2 and Ondansetron, Midazolam and Dexamethasone  Airway Management Planned: Oral ETT  Additional Equipment:   Intra-op Plan:   Post-operative Plan: Extubation in OR  Informed Consent: I have reviewed the patients History and Physical, chart, labs and discussed the procedure including the risks, benefits and alternatives for the proposed anesthesia with the patient or authorized representative who has indicated his/her understanding and acceptance.     Plan Discussed with: CRNA and Surgeon  Anesthesia Plan Comments:         Anesthesia Quick Evaluation

## 2017-09-29 ENCOUNTER — Encounter (HOSPITAL_BASED_OUTPATIENT_CLINIC_OR_DEPARTMENT_OTHER): Payer: Self-pay | Admitting: General Surgery

## 2017-10-14 DIAGNOSIS — M25551 Pain in right hip: Secondary | ICD-10-CM | POA: Diagnosis not present

## 2017-10-26 DIAGNOSIS — M25551 Pain in right hip: Secondary | ICD-10-CM | POA: Diagnosis not present

## 2017-11-11 DIAGNOSIS — M25551 Pain in right hip: Secondary | ICD-10-CM | POA: Diagnosis not present

## 2017-12-02 DIAGNOSIS — M25551 Pain in right hip: Secondary | ICD-10-CM | POA: Diagnosis not present

## 2017-12-23 DIAGNOSIS — M25551 Pain in right hip: Secondary | ICD-10-CM | POA: Diagnosis not present

## 2018-01-13 DIAGNOSIS — M25551 Pain in right hip: Secondary | ICD-10-CM | POA: Diagnosis not present

## 2018-02-17 DIAGNOSIS — M25551 Pain in right hip: Secondary | ICD-10-CM | POA: Diagnosis not present

## 2018-02-23 DIAGNOSIS — M25551 Pain in right hip: Secondary | ICD-10-CM | POA: Diagnosis not present

## 2018-05-25 DIAGNOSIS — M25512 Pain in left shoulder: Secondary | ICD-10-CM | POA: Diagnosis not present

## 2018-05-25 DIAGNOSIS — M25551 Pain in right hip: Secondary | ICD-10-CM | POA: Diagnosis not present

## 2018-06-03 ENCOUNTER — Emergency Department (HOSPITAL_COMMUNITY)
Admission: EM | Admit: 2018-06-03 | Discharge: 2018-06-03 | Discharge status: Absent without leave | Payer: BLUE CROSS/BLUE SHIELD

## 2018-06-21 DIAGNOSIS — R05 Cough: Secondary | ICD-10-CM | POA: Diagnosis not present

## 2018-06-21 DIAGNOSIS — R0600 Dyspnea, unspecified: Secondary | ICD-10-CM | POA: Diagnosis not present

## 2018-06-21 DIAGNOSIS — R509 Fever, unspecified: Secondary | ICD-10-CM | POA: Diagnosis not present

## 2018-06-21 DIAGNOSIS — Z20818 Contact with and (suspected) exposure to other bacterial communicable diseases: Secondary | ICD-10-CM | POA: Diagnosis not present

## 2018-06-30 DIAGNOSIS — M1611 Unilateral primary osteoarthritis, right hip: Secondary | ICD-10-CM | POA: Diagnosis not present

## 2018-06-30 DIAGNOSIS — M545 Low back pain: Secondary | ICD-10-CM | POA: Diagnosis not present

## 2018-07-05 DIAGNOSIS — M62830 Muscle spasm of back: Secondary | ICD-10-CM | POA: Diagnosis not present

## 2018-07-05 DIAGNOSIS — M9901 Segmental and somatic dysfunction of cervical region: Secondary | ICD-10-CM | POA: Diagnosis not present

## 2018-07-05 DIAGNOSIS — M9903 Segmental and somatic dysfunction of lumbar region: Secondary | ICD-10-CM | POA: Diagnosis not present

## 2018-07-05 DIAGNOSIS — M9902 Segmental and somatic dysfunction of thoracic region: Secondary | ICD-10-CM | POA: Diagnosis not present

## 2018-07-12 DIAGNOSIS — E559 Vitamin D deficiency, unspecified: Secondary | ICD-10-CM | POA: Diagnosis not present

## 2018-07-12 DIAGNOSIS — R7989 Other specified abnormal findings of blood chemistry: Secondary | ICD-10-CM | POA: Diagnosis not present

## 2018-07-12 DIAGNOSIS — E786 Lipoprotein deficiency: Secondary | ICD-10-CM | POA: Diagnosis not present

## 2018-07-12 DIAGNOSIS — R5381 Other malaise: Secondary | ICD-10-CM | POA: Diagnosis not present

## 2018-07-12 DIAGNOSIS — Z0000 Encounter for general adult medical examination without abnormal findings: Secondary | ICD-10-CM | POA: Diagnosis not present

## 2018-07-19 DIAGNOSIS — K429 Umbilical hernia without obstruction or gangrene: Secondary | ICD-10-CM | POA: Diagnosis not present

## 2018-07-19 DIAGNOSIS — F4321 Adjustment disorder with depressed mood: Secondary | ICD-10-CM | POA: Diagnosis not present

## 2018-07-19 DIAGNOSIS — L309 Dermatitis, unspecified: Secondary | ICD-10-CM | POA: Diagnosis not present

## 2018-07-19 DIAGNOSIS — Z0000 Encounter for general adult medical examination without abnormal findings: Secondary | ICD-10-CM | POA: Diagnosis not present

## 2018-07-19 DIAGNOSIS — M25551 Pain in right hip: Secondary | ICD-10-CM | POA: Diagnosis not present

## 2018-10-11 DIAGNOSIS — M1611 Unilateral primary osteoarthritis, right hip: Secondary | ICD-10-CM | POA: Diagnosis not present

## 2018-10-11 DIAGNOSIS — Z96651 Presence of right artificial knee joint: Secondary | ICD-10-CM | POA: Diagnosis not present

## 2018-10-25 DIAGNOSIS — Z96641 Presence of right artificial hip joint: Secondary | ICD-10-CM | POA: Diagnosis not present

## 2018-10-25 DIAGNOSIS — M25551 Pain in right hip: Secondary | ICD-10-CM | POA: Diagnosis not present

## 2018-11-03 DIAGNOSIS — Z96641 Presence of right artificial hip joint: Secondary | ICD-10-CM | POA: Diagnosis not present

## 2018-11-03 DIAGNOSIS — M25551 Pain in right hip: Secondary | ICD-10-CM | POA: Diagnosis not present

## 2018-11-07 DIAGNOSIS — M25551 Pain in right hip: Secondary | ICD-10-CM | POA: Diagnosis not present

## 2018-11-07 DIAGNOSIS — Z96641 Presence of right artificial hip joint: Secondary | ICD-10-CM | POA: Diagnosis not present

## 2018-11-10 DIAGNOSIS — Z96641 Presence of right artificial hip joint: Secondary | ICD-10-CM | POA: Diagnosis not present

## 2018-11-10 DIAGNOSIS — M25551 Pain in right hip: Secondary | ICD-10-CM | POA: Diagnosis not present

## 2018-11-15 DIAGNOSIS — Z471 Aftercare following joint replacement surgery: Secondary | ICD-10-CM | POA: Diagnosis not present

## 2018-11-15 DIAGNOSIS — Z96641 Presence of right artificial hip joint: Secondary | ICD-10-CM | POA: Diagnosis not present

## 2018-11-18 DIAGNOSIS — Z96641 Presence of right artificial hip joint: Secondary | ICD-10-CM | POA: Diagnosis not present

## 2018-11-18 DIAGNOSIS — M25551 Pain in right hip: Secondary | ICD-10-CM | POA: Diagnosis not present

## 2018-11-23 DIAGNOSIS — Z96641 Presence of right artificial hip joint: Secondary | ICD-10-CM | POA: Diagnosis not present

## 2018-11-23 DIAGNOSIS — M25551 Pain in right hip: Secondary | ICD-10-CM | POA: Diagnosis not present

## 2018-11-28 DIAGNOSIS — Z96641 Presence of right artificial hip joint: Secondary | ICD-10-CM | POA: Diagnosis not present

## 2018-11-28 DIAGNOSIS — M25551 Pain in right hip: Secondary | ICD-10-CM | POA: Diagnosis not present

## 2018-12-13 DIAGNOSIS — M9903 Segmental and somatic dysfunction of lumbar region: Secondary | ICD-10-CM | POA: Diagnosis not present

## 2018-12-13 DIAGNOSIS — M9902 Segmental and somatic dysfunction of thoracic region: Secondary | ICD-10-CM | POA: Diagnosis not present

## 2018-12-13 DIAGNOSIS — M9901 Segmental and somatic dysfunction of cervical region: Secondary | ICD-10-CM | POA: Diagnosis not present

## 2018-12-13 DIAGNOSIS — M62830 Muscle spasm of back: Secondary | ICD-10-CM | POA: Diagnosis not present

## 2018-12-15 DIAGNOSIS — M9902 Segmental and somatic dysfunction of thoracic region: Secondary | ICD-10-CM | POA: Diagnosis not present

## 2018-12-15 DIAGNOSIS — M62830 Muscle spasm of back: Secondary | ICD-10-CM | POA: Diagnosis not present

## 2018-12-15 DIAGNOSIS — M9901 Segmental and somatic dysfunction of cervical region: Secondary | ICD-10-CM | POA: Diagnosis not present

## 2018-12-15 DIAGNOSIS — M9903 Segmental and somatic dysfunction of lumbar region: Secondary | ICD-10-CM | POA: Diagnosis not present

## 2018-12-28 ENCOUNTER — Other Ambulatory Visit: Payer: Self-pay

## 2018-12-28 DIAGNOSIS — Z20828 Contact with and (suspected) exposure to other viral communicable diseases: Secondary | ICD-10-CM | POA: Diagnosis not present

## 2018-12-28 DIAGNOSIS — Z20822 Contact with and (suspected) exposure to covid-19: Secondary | ICD-10-CM

## 2018-12-29 LAB — NOVEL CORONAVIRUS, NAA: SARS-CoV-2, NAA: NOT DETECTED

## 2019-01-12 DIAGNOSIS — Z96641 Presence of right artificial hip joint: Secondary | ICD-10-CM | POA: Diagnosis not present

## 2019-01-12 DIAGNOSIS — M25551 Pain in right hip: Secondary | ICD-10-CM | POA: Diagnosis not present

## 2019-01-23 DIAGNOSIS — Z96641 Presence of right artificial hip joint: Secondary | ICD-10-CM | POA: Diagnosis not present

## 2019-01-23 DIAGNOSIS — M25551 Pain in right hip: Secondary | ICD-10-CM | POA: Diagnosis not present

## 2019-02-01 DIAGNOSIS — M25551 Pain in right hip: Secondary | ICD-10-CM | POA: Diagnosis not present

## 2019-02-01 DIAGNOSIS — Z96641 Presence of right artificial hip joint: Secondary | ICD-10-CM | POA: Diagnosis not present

## 2019-02-14 DIAGNOSIS — M25551 Pain in right hip: Secondary | ICD-10-CM | POA: Diagnosis not present

## 2019-02-14 DIAGNOSIS — Z96641 Presence of right artificial hip joint: Secondary | ICD-10-CM | POA: Diagnosis not present

## 2019-03-03 ENCOUNTER — Other Ambulatory Visit: Payer: Self-pay

## 2019-03-03 DIAGNOSIS — Z20828 Contact with and (suspected) exposure to other viral communicable diseases: Secondary | ICD-10-CM | POA: Diagnosis not present

## 2019-03-03 DIAGNOSIS — Z20822 Contact with and (suspected) exposure to covid-19: Secondary | ICD-10-CM

## 2019-03-04 LAB — NOVEL CORONAVIRUS, NAA: SARS-CoV-2, NAA: NOT DETECTED

## 2019-12-07 DIAGNOSIS — Z96641 Presence of right artificial hip joint: Secondary | ICD-10-CM | POA: Diagnosis not present

## 2019-12-07 DIAGNOSIS — M25552 Pain in left hip: Secondary | ICD-10-CM | POA: Diagnosis not present

## 2019-12-07 DIAGNOSIS — Z471 Aftercare following joint replacement surgery: Secondary | ICD-10-CM | POA: Diagnosis not present

## 2019-12-16 DIAGNOSIS — Z23 Encounter for immunization: Secondary | ICD-10-CM | POA: Diagnosis not present

## 2020-01-14 IMAGING — CT CT HEART SCORING
2 of 3 series · 9 of 20 positions shown, 11 images · non-contrast
Comparison: None.

CLINICAL DATA: Low HCL

EXAM:
CT HEART FOR CALCIUM SCORING
TECHNIQUE: CT heart was performed on a 64 channel system using prospective ECG
gating.
A non-contrast exam for calcium scoring was performed.
Note that this exam targets the heart and the chest was not imaged
in its entirety.

[Series 3: calcium scoring 2.00 br40 bestdiast 69% ax fov · axial · 0.38mm/px · z∈[+1624,+1700]mm · 5 of 58 slices shown, 7 images]
[im 10/58  vessel]
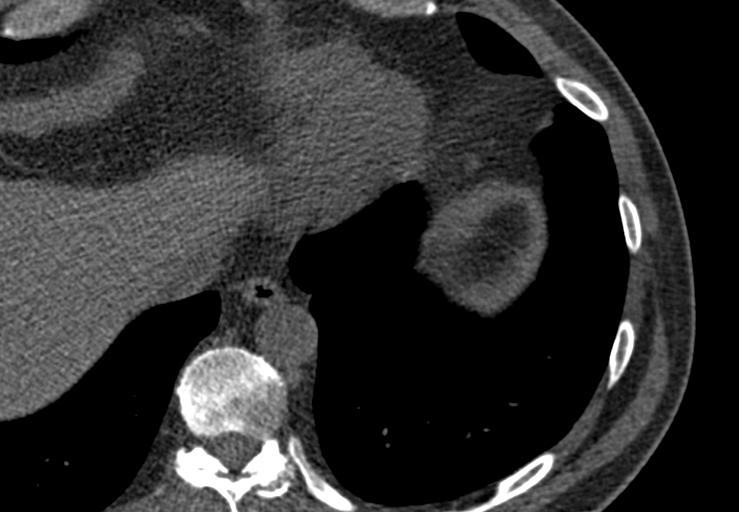
[im 10/58  lung]
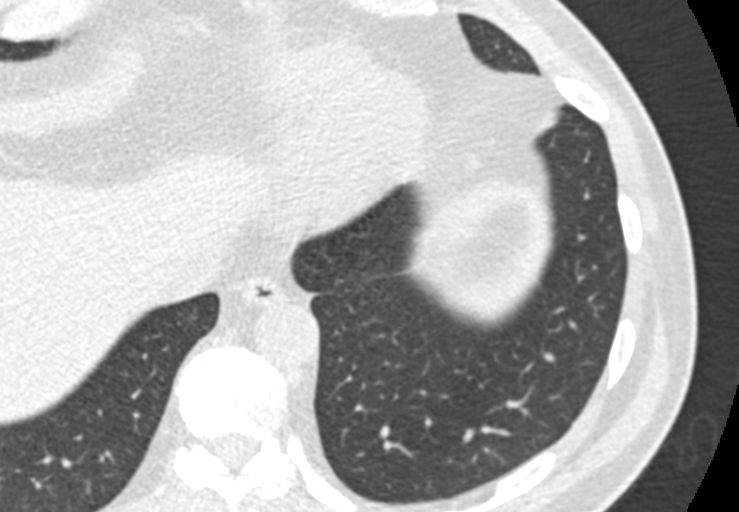
[im 20/58  vessel]
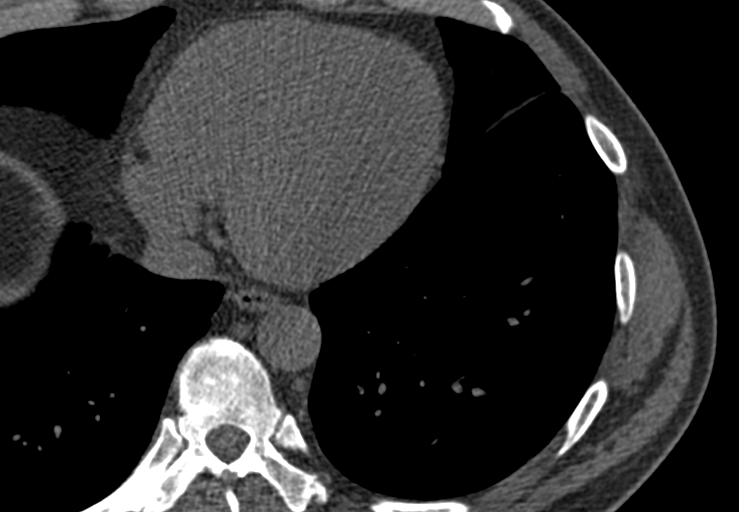
[im 29/58  vessel]
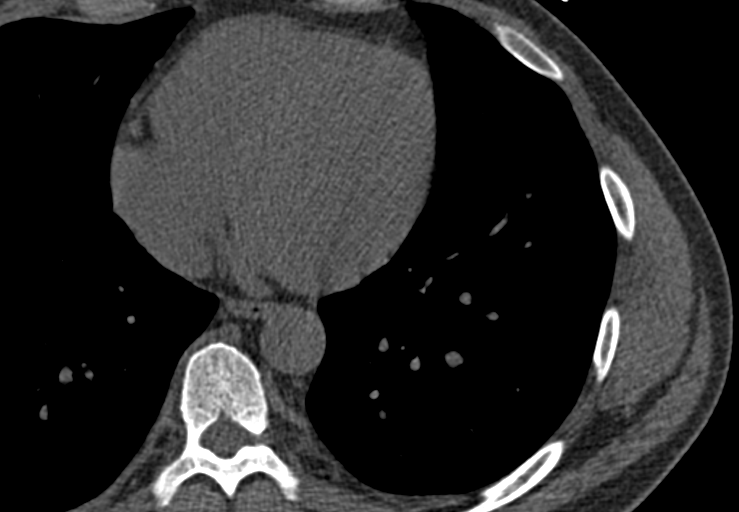
[im 39/58  vessel]
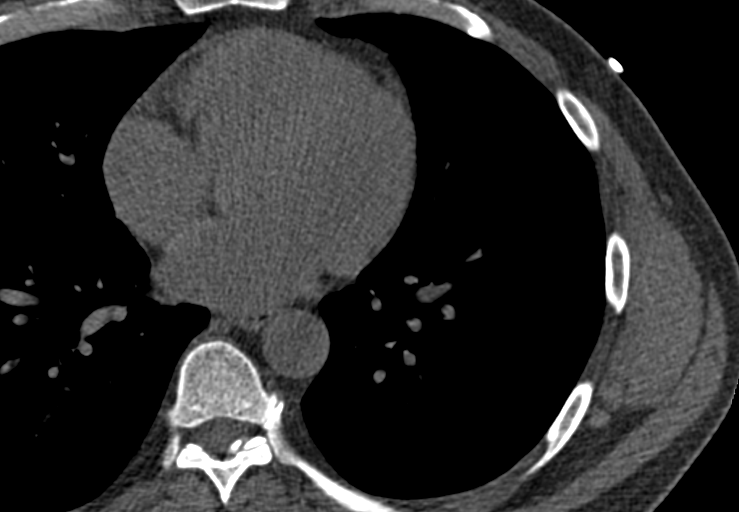
[im 48/58  vessel]
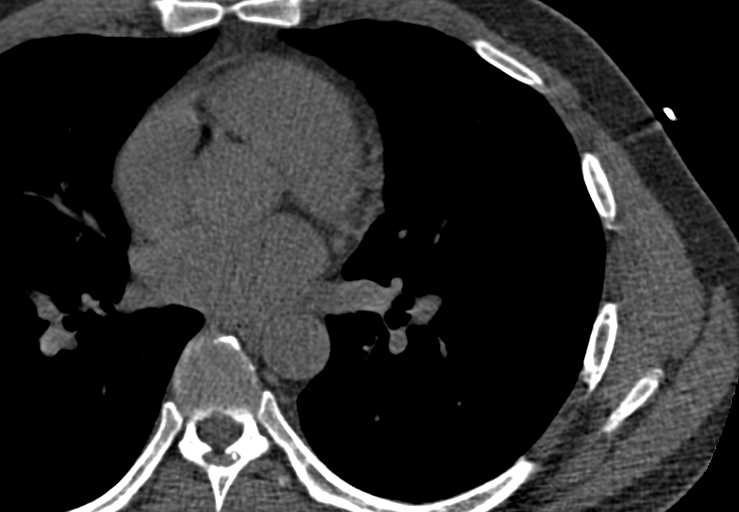
[im 48/58  lung]
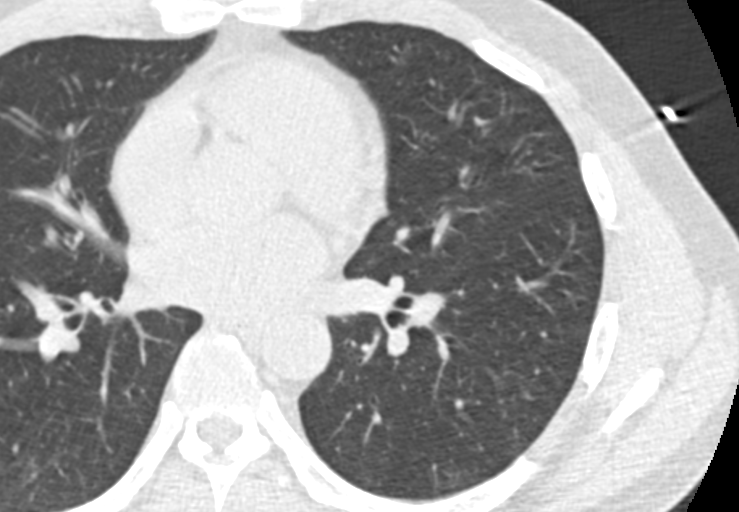

[Series 9: calcium scoring 2.00 br60 bestdiast 69% ax fov · axial · 0.38mm/px · z∈[+1620,+1680]mm · 4 of 60 slices shown]
[im 10/60  vessel]
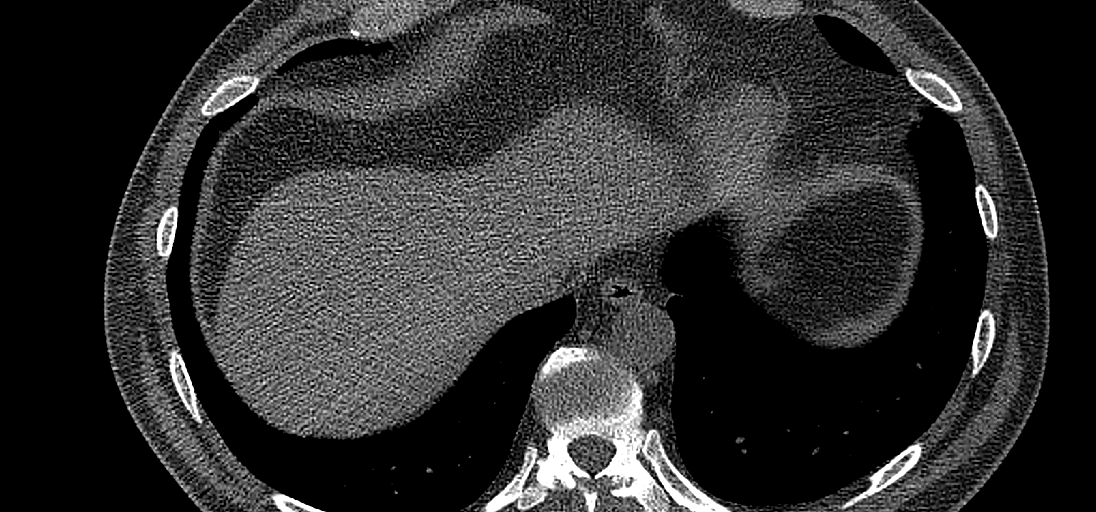
[im 20/60  vessel]
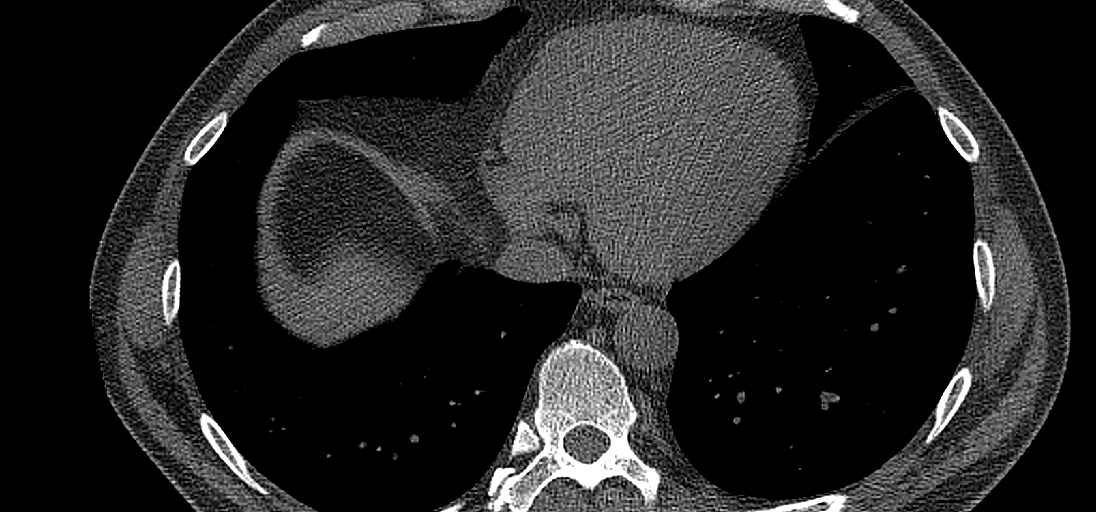
[im 30/60  vessel]
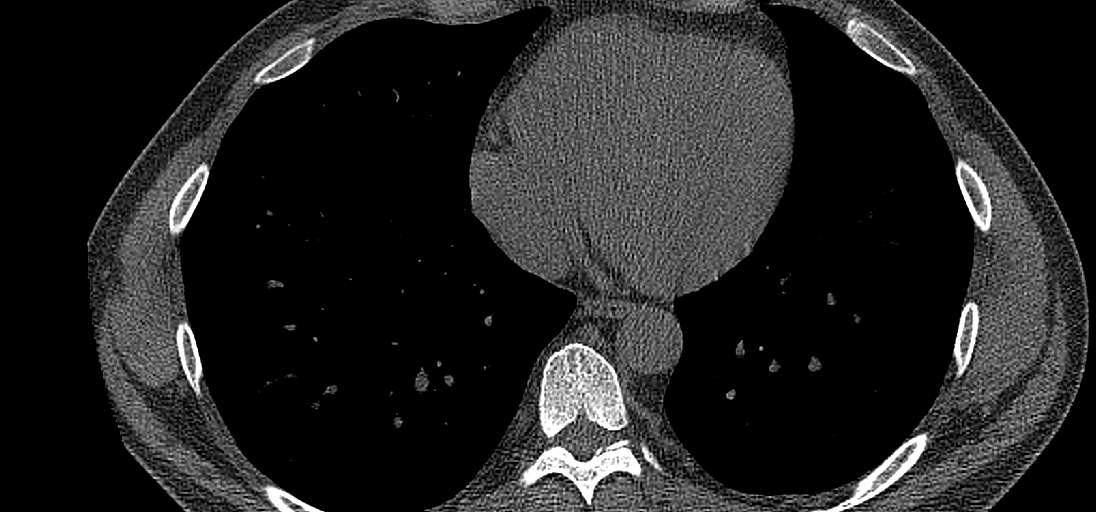
[im 40/60  vessel]
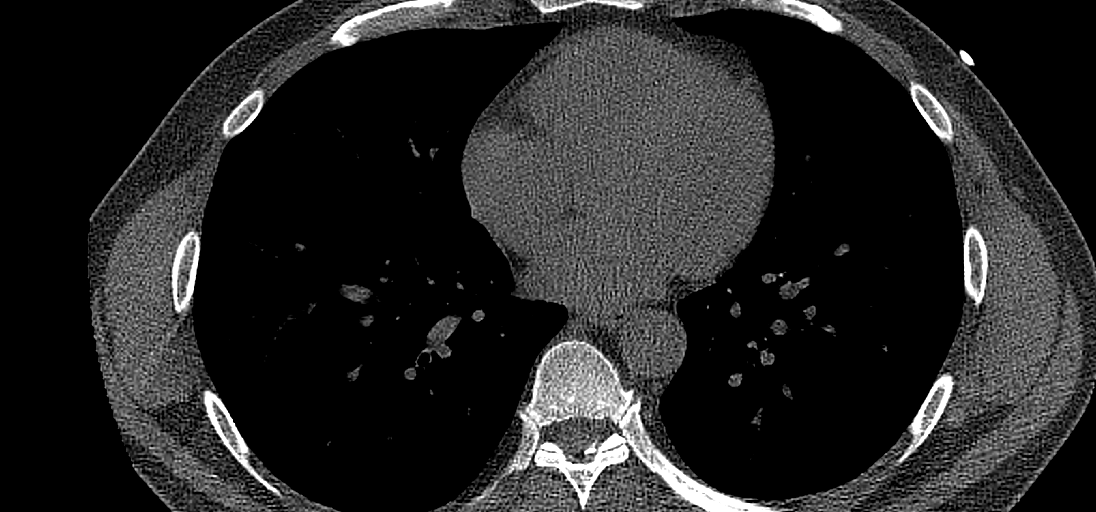

[9 of 20 positions shown; findings below may reference images not displayed]

FINDINGS: Technical quality: Good.

CORONARY CALCIUM

Total Agatston Score: 17.6 with calcifications in a diagonal branch
off of the left anterior descending coronary artery.

[HOSPITAL] percentile:  81

OTHER FINDINGS:

Cardiovascular: Heart is normal size. Visualized aorta normal
caliber.

Mediastinum/Nodes: No adenopathy in the lower mediastinum or hila.

Lungs/Pleura: Visualized lungs clear.  No effusions.

Upper Abdomen: Imaging into the upper abdomen shows no acute
findings.

Musculoskeletal: Chest wall soft tissues are unremarkable. No acute
bony abnormality.
IMPRESSION: The observed calcium score of 17.6 is at the percentile 81 for
subjects of the same age, gender and race/ethnicity who are free of
clinical cardiovascular disease and treated diabetes.

No acute or significant extracardiac abnormality

## 2020-03-24 DIAGNOSIS — Z1152 Encounter for screening for COVID-19: Secondary | ICD-10-CM | POA: Diagnosis not present

## 2020-04-09 DIAGNOSIS — R8290 Unspecified abnormal findings in urine: Secondary | ICD-10-CM | POA: Diagnosis not present

## 2020-04-09 DIAGNOSIS — J45909 Unspecified asthma, uncomplicated: Secondary | ICD-10-CM | POA: Diagnosis not present

## 2020-04-09 DIAGNOSIS — Z01818 Encounter for other preprocedural examination: Secondary | ICD-10-CM | POA: Diagnosis not present

## 2020-04-12 DIAGNOSIS — E786 Lipoprotein deficiency: Secondary | ICD-10-CM | POA: Diagnosis not present

## 2020-04-23 DIAGNOSIS — M1612 Unilateral primary osteoarthritis, left hip: Secondary | ICD-10-CM | POA: Diagnosis not present

## 2020-05-28 DIAGNOSIS — Z471 Aftercare following joint replacement surgery: Secondary | ICD-10-CM | POA: Diagnosis not present

## 2020-05-28 DIAGNOSIS — Z96642 Presence of left artificial hip joint: Secondary | ICD-10-CM | POA: Diagnosis not present

## 2020-07-23 DIAGNOSIS — E786 Lipoprotein deficiency: Secondary | ICD-10-CM | POA: Diagnosis not present

## 2020-07-23 DIAGNOSIS — Z125 Encounter for screening for malignant neoplasm of prostate: Secondary | ICD-10-CM | POA: Diagnosis not present

## 2020-08-08 DIAGNOSIS — Z0000 Encounter for general adult medical examination without abnormal findings: Secondary | ICD-10-CM | POA: Diagnosis not present

## 2020-08-08 DIAGNOSIS — E559 Vitamin D deficiency, unspecified: Secondary | ICD-10-CM | POA: Diagnosis not present

## 2020-08-08 DIAGNOSIS — Z1212 Encounter for screening for malignant neoplasm of rectum: Secondary | ICD-10-CM | POA: Diagnosis not present

## 2020-08-08 DIAGNOSIS — R82998 Other abnormal findings in urine: Secondary | ICD-10-CM | POA: Diagnosis not present

## 2021-07-16 DIAGNOSIS — F419 Anxiety disorder, unspecified: Secondary | ICD-10-CM | POA: Diagnosis not present

## 2021-07-16 DIAGNOSIS — F338 Other recurrent depressive disorders: Secondary | ICD-10-CM | POA: Diagnosis not present

## 2021-07-16 DIAGNOSIS — R41840 Attention and concentration deficit: Secondary | ICD-10-CM | POA: Diagnosis not present

## 2021-08-06 DIAGNOSIS — E786 Lipoprotein deficiency: Secondary | ICD-10-CM | POA: Diagnosis not present

## 2021-08-06 DIAGNOSIS — E559 Vitamin D deficiency, unspecified: Secondary | ICD-10-CM | POA: Diagnosis not present

## 2021-08-06 DIAGNOSIS — Z125 Encounter for screening for malignant neoplasm of prostate: Secondary | ICD-10-CM | POA: Diagnosis not present

## 2021-08-13 DIAGNOSIS — F902 Attention-deficit hyperactivity disorder, combined type: Secondary | ICD-10-CM | POA: Diagnosis not present

## 2021-08-13 DIAGNOSIS — Z79899 Other long term (current) drug therapy: Secondary | ICD-10-CM | POA: Diagnosis not present

## 2021-08-14 DIAGNOSIS — E559 Vitamin D deficiency, unspecified: Secondary | ICD-10-CM | POA: Diagnosis not present

## 2021-08-14 DIAGNOSIS — Z1331 Encounter for screening for depression: Secondary | ICD-10-CM | POA: Diagnosis not present

## 2021-08-14 DIAGNOSIS — Z1339 Encounter for screening examination for other mental health and behavioral disorders: Secondary | ICD-10-CM | POA: Diagnosis not present

## 2021-08-14 DIAGNOSIS — Z0000 Encounter for general adult medical examination without abnormal findings: Secondary | ICD-10-CM | POA: Diagnosis not present

## 2021-11-13 DIAGNOSIS — Z79899 Other long term (current) drug therapy: Secondary | ICD-10-CM | POA: Diagnosis not present

## 2021-11-13 DIAGNOSIS — F902 Attention-deficit hyperactivity disorder, combined type: Secondary | ICD-10-CM | POA: Diagnosis not present

## 2021-12-31 DIAGNOSIS — H5213 Myopia, bilateral: Secondary | ICD-10-CM | POA: Diagnosis not present

## 2021-12-31 DIAGNOSIS — H52223 Regular astigmatism, bilateral: Secondary | ICD-10-CM | POA: Diagnosis not present

## 2021-12-31 DIAGNOSIS — H524 Presbyopia: Secondary | ICD-10-CM | POA: Diagnosis not present
